# Patient Record
Sex: Male | Born: 2005 | Race: White | Hispanic: No | Marital: Single | Smoking: Never smoker
Health system: Southern US, Community
[De-identification: ages and names within clinical notes are randomized; demographics above are authoritative.]

## PROBLEM LIST (undated history)

## (undated) HISTORY — PX: CHOLECYSTECTOMY: SHX55

---
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
## 2024-01-18 ENCOUNTER — Ambulatory Visit (HOSPITAL_BASED_OUTPATIENT_CLINIC_OR_DEPARTMENT_OTHER): Payer: Self-pay

## 2024-03-13 ENCOUNTER — Ambulatory Visit (HOSPITAL_BASED_OUTPATIENT_CLINIC_OR_DEPARTMENT_OTHER): Admission: EM | Admit: 2024-03-13 | Discharge: 2024-03-13 | Disposition: A | Discharge status: Home / Self Care

## 2024-03-13 ENCOUNTER — Encounter (HOSPITAL_BASED_OUTPATIENT_CLINIC_OR_DEPARTMENT_OTHER): Payer: Self-pay

## 2024-03-13 DIAGNOSIS — J029 Acute pharyngitis, unspecified: Secondary | ICD-10-CM | POA: Diagnosis not present

## 2024-03-13 NOTE — ED Provider Notes (Signed)
 [facility]    CSN: [id number] Arrival date & time: 03/13/24  1308      History   Chief Complaint Chief Complaint  Patient presents with   Sore Throat    HPI Dylan Walker is a 18 y.o. male.   Pt c/o sore throat starting yesterday. He is also having runny nose/nasal congestion that is worst in the morning and gets better as the day goes on. He is having increased sneezing. Pt denies fever, cough, chills, or body aches. He has taken benadryl with some relief. He requested a work note for his symptoms.     Sore Throat    History reviewed. No pertinent past medical history.  There are no active problems to display for this patient.   Past Surgical History:  Procedure Laterality Date   CHOLECYSTECTOMY         Home Medications    Prior to Admission medications   Not on File    Family History History reviewed. No pertinent family history.  Social History Social History   Tobacco Use   Smoking status: Never   Smokeless tobacco: Never  Vaping Use   Vaping status: Never Used  Substance Use Topics   Alcohol use: No   Drug use: No     Allergies   Patient has no known allergies.   Review of Systems Review of Systems See HPI  Physical Exam Triage Vital Signs ED Triage Vitals  Encounter Vitals Group     BP 03/13/24 1323 128/83     Girls Systolic BP Percentile --      Girls Diastolic BP Percentile --      Boys Systolic BP Percentile --      Boys Diastolic BP Percentile --      Pulse Rate 03/13/24 1323 76     Resp 03/13/24 1323 20     Temp 03/13/24 1323 99 F (37.2 C)     Temp Source 03/13/24 1323 Oral     SpO2 03/13/24 1323 96 %     Weight --      Height --      Head Circumference --      Peak Flow --      Pain Score 03/13/24 1321 4     Pain Loc --      Pain Education --      Exclude from Growth Chart --    No data found.  Updated Vital Signs BP 128/83 (BP Location: Right Arm)   Pulse 76   Temp 99 F (37.2 C) (Oral)    Resp 20   SpO2 96%   Visual Acuity Right Eye Distance:   Left Eye Distance:   Bilateral Distance:    Right Eye Near:   Left Eye Near:    Bilateral Near:     Physical Exam Constitutional:      General: He is not in acute distress.    Appearance: Normal appearance. He is not ill-appearing, toxic-appearing or diaphoretic.  HENT:     Right Ear: Tympanic membrane, ear canal and external ear normal.     Left Ear: Tympanic membrane, ear canal and external ear normal.     Mouth/Throat:     Pharynx: Oropharynx is clear.  Eyes:     Conjunctiva/sclera: Conjunctivae normal.  Cardiovascular:     Rate and Rhythm: Normal rate and regular rhythm.     Pulses: Normal pulses.     Heart sounds: Normal heart sounds.  Pulmonary:     Effort:  Pulmonary effort is normal.     Breath sounds: Normal breath sounds.  Musculoskeletal:        General: Normal range of motion.  Skin:    General: Skin is warm and dry.  Neurological:     Mental Status: He is alert.  Psychiatric:        Mood and Affect: Mood normal.      UC Treatments / Results  Labs (all labs ordered are listed, but only abnormal results are displayed) Labs Reviewed - No data to display  EKG   Radiology No results found.  Procedures Procedures (including critical care time)  Medications Ordered in UC Medications - No data to display  Initial Impression / Assessment and Plan / UC Course  I have reviewed the triage vital signs and the nursing notes.  Pertinent labs & imaging results that were available during my care of the patient were reviewed by me and considered in my medical decision making (see chart for details).     Sore throat.  Viral illness verus allergies-no concerns on exam.  Recommend symptomatic treatment for relief of symptoms. Rest, hydrate and follow-up as needed  Final Clinical Impressions(s) / UC Diagnoses   Final diagnoses:  Sore throat     Discharge Instructions      No concerns on exam  today.  Recommend starting back on your allergy medication. Start Zyrtec  and Flonase  daily. Follow-up for any continued issues   ED Prescriptions   None    PDMP not reviewed this encounter.   Adah Wilbert LABOR, FNP 03/14/24 1550

## 2024-03-13 NOTE — ED Triage Notes (Signed)
 Pt c/o sore throat starting yesterday. He is also having runny nose/nasal congestion that is worst in the morning and gets better as the day goes on. He is having increased sneezing. Pt denies fever, cough, chills, or body aches. He has taken benadryl with some relief. He requested a work note for his symptoms.

## 2024-03-13 NOTE — Discharge Instructions (Signed)
 No concerns on exam today.  Recommend starting back on your allergy medication. Start Zyrtec  and Flonase  daily. Follow-up for any continued issues

## 2024-03-29 ENCOUNTER — Ambulatory Visit (HOSPITAL_BASED_OUTPATIENT_CLINIC_OR_DEPARTMENT_OTHER)
Admission: RE | Admit: 2024-03-29 | Discharge: 2024-03-29 | Disposition: A | Discharge status: Home / Self Care | Source: Ambulatory Visit | Attending: Family Medicine | Admitting: Family Medicine

## 2024-03-29 ENCOUNTER — Encounter (HOSPITAL_BASED_OUTPATIENT_CLINIC_OR_DEPARTMENT_OTHER): Payer: Self-pay

## 2024-03-29 VITALS — BP 138/78 | HR 74 | Temp 97.7°F | Resp 20

## 2024-03-29 DIAGNOSIS — J029 Acute pharyngitis, unspecified: Secondary | ICD-10-CM | POA: Insufficient documentation

## 2024-03-29 DIAGNOSIS — J039 Acute tonsillitis, unspecified: Secondary | ICD-10-CM | POA: Diagnosis present

## 2024-03-29 DIAGNOSIS — R52 Pain, unspecified: Secondary | ICD-10-CM | POA: Diagnosis present

## 2024-03-29 LAB — POCT RAPID STREP A (OFFICE): Rapid Strep A Screen: NEGATIVE

## 2024-03-29 LAB — POC SOFIA SARS ANTIGEN FIA: SARS Coronavirus 2 Ag: NEGATIVE

## 2024-03-29 MED ORDER — AMOXICILLIN 875 MG PO TABS: 875.0000 mg | ORAL_TABLET | Freq: Two times a day (BID) | ORAL | 0 refills | Status: AC

## 2024-03-29 NOTE — ED Provider Notes (Signed)
 [facility]    CSN: [id number] Arrival date & time: 03/29/24  1155      History   Chief Complaint Chief Complaint  Patient presents with   Sore Throat    Entered by patient    HPI Dylan Walker is a 18 y.o. male.   18 year old male with report of acute onset of sore throat with nasal drainage on the night of 03/28/2024.  His symptoms have persisted today and he has some malaise and mild bodyaches.  He denies fever, cough, nausea, vomiting, constipation, diarrhea.   Sore Throat Pertinent negatives include no chest pain and no abdominal pain.    History reviewed. No pertinent past medical history.  There are no active problems to display for this patient.   Past Surgical History:  Procedure Laterality Date   CHOLECYSTECTOMY         Home Medications    Prior to Admission medications   Medication Sig Start Date End Date Taking? Authorizing Provider  amoxicillin  (AMOXIL ) 875 MG tablet Take 1 tablet (875 mg total) by mouth 2 (two) times daily for 7 days. 03/29/24 04/05/24 Yes Ival Domino, FNP    Family History History reviewed. No pertinent family history.  Social History Social History   Tobacco Use   Smoking status: Never   Smokeless tobacco: Never  Vaping Use   Vaping status: Never Used  Substance Use Topics   Alcohol use: No   Drug use: No     Allergies   Patient has no known allergies.   Review of Systems Review of Systems  Constitutional:  Negative for chills and fever.  HENT:  Positive for congestion, postnasal drip, rhinorrhea and sore throat. Negative for ear pain.   Eyes:  Negative for pain and visual disturbance.  Respiratory:  Negative for cough.   Cardiovascular:  Negative for chest pain and palpitations.  Gastrointestinal:  Negative for abdominal pain, constipation, diarrhea, nausea and vomiting.  Genitourinary:  Negative for dysuria and hematuria.  Musculoskeletal:  Positive for arthralgias. Negative for back pain.   Skin:  Negative for color change and rash.  Neurological:  Negative for seizures and syncope.  All other systems reviewed and are negative.    Physical Exam Triage Vital Signs ED Triage Vitals [03/29/24 1222]  Encounter Vitals Group     BP 138/78     Girls Systolic BP Percentile      Girls Diastolic BP Percentile      Boys Systolic BP Percentile      Boys Diastolic BP Percentile      Pulse Rate 74     Resp 20     Temp 97.7 F (36.5 C)     Temp Source Oral     SpO2 97 %     Weight      Height      Head Circumference      Peak Flow      Pain Score 2     Pain Loc      Pain Education      Exclude from Growth Chart    No data found.  Updated Vital Signs BP 138/78 (BP Location: Right Arm)   Pulse 74   Temp 97.7 F (36.5 C) (Oral)   Resp 20   SpO2 97%   Visual Acuity Right Eye Distance:   Left Eye Distance:   Bilateral Distance:    Right Eye Near:   Left Eye Near:    Bilateral Near:     Physical Exam  Vitals and nursing note reviewed.  Constitutional:      General: He is not in acute distress.    Appearance: He is well-developed. He is not ill-appearing or toxic-appearing.  HENT:     Head: Normocephalic and atraumatic.     Right Ear: Hearing, tympanic membrane, ear canal and external ear normal.     Left Ear: Hearing, tympanic membrane, ear canal and external ear normal.     Nose: Congestion and rhinorrhea present. Rhinorrhea is clear.     Right Sinus: No maxillary sinus tenderness or frontal sinus tenderness.     Left Sinus: No maxillary sinus tenderness or frontal sinus tenderness.     Mouth/Throat:     Lips: Pink.     Mouth: Mucous membranes are moist.     Pharynx: Uvula midline. Posterior oropharyngeal erythema present. No oropharyngeal exudate.     Tonsils: No tonsillar exudate (Erythema and enlargement of tonsils bilaterally but no exudate).  Eyes:     Conjunctiva/sclera: Conjunctivae normal.     Pupils: Pupils are equal, round, and reactive to  light.  Cardiovascular:     Rate and Rhythm: Normal rate and regular rhythm.     Heart sounds: S1 normal and S2 normal. No murmur heard. Pulmonary:     Effort: Pulmonary effort is normal. No respiratory distress.     Breath sounds: Normal breath sounds. No decreased breath sounds, wheezing, rhonchi or rales.  Abdominal:     General: Bowel sounds are normal.     Palpations: Abdomen is soft.     Tenderness: There is no abdominal tenderness.  Musculoskeletal:        General: No swelling.     Cervical back: Neck supple.  Lymphadenopathy:     Head:     Right side of head: No submental, submandibular, tonsillar, preauricular or posterior auricular adenopathy.     Left side of head: No submental, submandibular, tonsillar, preauricular or posterior auricular adenopathy.     Cervical: Cervical adenopathy present.     Right cervical: Superficial cervical adenopathy present.     Left cervical: Superficial cervical adenopathy present.  Skin:    General: Skin is warm and dry.     Capillary Refill: Capillary refill takes less than 2 seconds.     Findings: No rash.  Neurological:     Mental Status: He is alert and oriented to person, place, and time.  Psychiatric:        Mood and Affect: Mood normal.      UC Treatments / Results  Labs (all labs ordered are listed, but only abnormal results are displayed) Labs Reviewed  POCT RAPID STREP A (OFFICE) - Normal  POC SOFIA SARS ANTIGEN FIA - Normal  CULTURE, GROUP A STREP [facility])    EKG   Radiology No results found.  Procedures Procedures (including critical care time)  Medications Ordered in UC Medications - No data to display  Initial Impression / Assessment and Plan / UC Course  I have reviewed the triage vital signs and the nursing notes.  Pertinent labs & imaging results that were available during my care of the patient were reviewed by me and considered in my medical decision making (see chart for details).  Plan of  Care: Tonsillitis with sore throat and bodyaches: Rapid strep is negative.  Throat culture sent.  Will adjust the plan of care, as needed once the culture results.  Amoxicillin , 875 mg, 1 pill twice daily for 7 days.  Will be instructed to stop  the amoxicillin  if the throat culture is negative.    Rapid COVID test was negative.  If symptoms persist, needs to retest for COVID at home tomorrow afternoon.  Work excuse provided.  Follow-up if symptoms do not improve, worsen or new symptoms occur.  I reviewed the plan of care with the patient and/or the patient's guardian.  The patient and/or guardian had time to ask questions and acknowledged that the questions were answered.  I provided instruction on symptoms or reasons to return here or to go to an ER, if symptoms/condition did not improve, worsened or if new symptoms occurred.  Final Clinical Impressions(s) / UC Diagnoses   Final diagnoses:  Sore throat  Generalized body aches  Acute tonsillitis, unspecified etiology     Discharge Instructions      Tonsillitis with sore throat and bodyaches: COVID test was negative.  Rapid strep is negative.  Throat culture sent.  Get plenty of fluids and rest.  Amoxicillin , 875 mg, 1 pill twice daily for tonsillitis.  Patient will be called if his throat culture is negative and asked to stop the antibiotic.  If his throat culture is positive the results will be available on the portal.  Encouraged that if his symptoms persist or worsen, he should retest for COVID at home tomorrow.  Work excuse provided.  Follow-up here as needed.     ED Prescriptions     Medication Sig Dispense Auth. Provider   amoxicillin  (AMOXIL ) 875 MG tablet Take 1 tablet (875 mg total) by mouth 2 (two) times daily for 7 days. 14 tablet Danaja Lasota, FNP      PDMP not reviewed this encounter.   Ival Domino, FNP 03/29/24 1314

## 2024-03-29 NOTE — Discharge Instructions (Addendum)
 Tonsillitis with sore throat and bodyaches: COVID test was negative.  Rapid strep is negative.  Throat culture sent.  Get plenty of fluids and rest.  Amoxicillin , 875 mg, 1 pill twice daily for tonsillitis.  Patient will be called if his throat culture is negative and asked to stop the antibiotic.  If his throat culture is positive the results will be available on the portal.  Encouraged that if his symptoms persist or worsen, he should retest for COVID at home tomorrow.  Work excuse provided.  Follow-up here as needed.

## 2024-03-29 NOTE — ED Triage Notes (Signed)
 Pt c/o sore throat, nasal drainage started last night.

## 2024-04-01 ENCOUNTER — Ambulatory Visit (HOSPITAL_COMMUNITY): Payer: Self-pay

## 2024-04-01 LAB — CULTURE, GROUP A STREP (THRC)

## 2024-04-02 NOTE — Progress Notes (Signed)
 Throat culture is negative.  No strep throat.  He was sent home on amoxicillin  for tonsillitis due to the appearance of his throat on exam.  Advised that if he is doing well and tolerating the amoxicillin  he can complete it.  If he is having any problems tolerating the amoxicillin  he could stop it and additional antibiotic is not needed.  Unable to speak to him directly but was able to leave a voicemail message

## 2024-07-04 ENCOUNTER — Ambulatory Visit (HOSPITAL_BASED_OUTPATIENT_CLINIC_OR_DEPARTMENT_OTHER)
Admission: RE | Admit: 2024-07-04 | Discharge: 2024-07-04 | Disposition: A | Discharge status: Home / Self Care | Payer: Self-pay | Source: Ambulatory Visit | Attending: Family Medicine | Admitting: Family Medicine

## 2024-07-04 ENCOUNTER — Encounter (HOSPITAL_BASED_OUTPATIENT_CLINIC_OR_DEPARTMENT_OTHER): Payer: Self-pay

## 2024-07-04 ENCOUNTER — Other Ambulatory Visit (HOSPITAL_BASED_OUTPATIENT_CLINIC_OR_DEPARTMENT_OTHER): Payer: Self-pay

## 2024-07-04 VITALS — BP 133/76 | HR 126 | Temp 102.6°F | Resp 22

## 2024-07-04 DIAGNOSIS — R509 Fever, unspecified: Secondary | ICD-10-CM | POA: Diagnosis not present

## 2024-07-04 DIAGNOSIS — R112 Nausea with vomiting, unspecified: Secondary | ICD-10-CM | POA: Diagnosis not present

## 2024-07-04 DIAGNOSIS — J101 Influenza due to other identified influenza virus with other respiratory manifestations: Secondary | ICD-10-CM | POA: Diagnosis not present

## 2024-07-04 LAB — POCT INFLUENZA A/B
Influenza A, POC: NEGATIVE
Influenza B, POC: POSITIVE — AB

## 2024-07-04 MED ORDER — OSELTAMIVIR PHOSPHATE 75 MG PO CAPS
75.0000 mg | ORAL_CAPSULE | Freq: Two times a day (BID) | ORAL | 0 refills | Status: DC
  Filled 2024-07-04: qty 10, 5d supply, fill #0

## 2024-07-04 MED ORDER — ONDANSETRON 4 MG PO TBDP
4.0000 mg | ORAL_TABLET | Freq: Three times a day (TID) | ORAL | 0 refills | Status: AC | PRN
  Filled 2024-07-04: qty 20, 7d supply, fill #0

## 2024-07-04 MED ORDER — IBUPROFEN 600 MG PO TABS
600.0000 mg | ORAL_TABLET | Freq: Once | ORAL | Status: AC
  Administered 2024-07-04: 600 mg via ORAL

## 2024-07-04 MED ORDER — ONDANSETRON 4 MG PO TBDP
4.0000 mg | ORAL_TABLET | Freq: Once | ORAL | Status: AC
  Administered 2024-07-04: 4 mg via ORAL

## 2024-07-04 MED ORDER — PROMETHAZINE-DM 6.25-15 MG/5ML PO SYRP
5.0000 mL | ORAL_SOLUTION | Freq: Four times a day (QID) | ORAL | 0 refills | Status: DC | PRN
  Filled 2024-07-04: qty 118, 6d supply, fill #0

## 2024-07-04 NOTE — ED Triage Notes (Signed)
 Fever/headache onset Wednesday. States right side of face painful last night. Vomited this morning. Patient has been taking ibuprofen , nyquil. Last dose of nyquil was last night. Temp 103 on arrival.

## 2024-07-04 NOTE — ED Provider Notes (Addendum)
 " [facility]    CSN: [id number] Arrival date & time: 07/04/24  1417      History   Chief Complaint Chief Complaint  Patient presents with   Fever    Entered by patient    HPI Dylan Walker is a 18 y.o. male.   18 year old male with complaint of fever and headache that started on the night of 07/02/2024.  He had facial pain on the right side of his face last night.  He has been vomiting this morning.  He has taken ibuprofen  and NyQuil.  His temp is 103.0 on arrival to urgent care.   Fever Associated symptoms: congestion, cough and rhinorrhea   Associated symptoms: no chest pain, no chills, no diarrhea, no dysuria, no ear pain, no nausea, no rash, no sore throat and no vomiting     History reviewed. No pertinent past medical history.  There are no active problems to display for this patient.   Past Surgical History:  Procedure Laterality Date   CHOLECYSTECTOMY         Home Medications    Prior to Admission medications  Medication Sig Start Date End Date Taking? Authorizing Provider  ondansetron  (ZOFRAN -ODT) 4 MG disintegrating tablet Take 1 tablet (4 mg total) by mouth every 8 (eight) hours as needed for nausea or vomiting. 07/04/24  Yes Ival Domino, FNP  oseltamivir  (TAMIFLU ) 75 MG capsule Take 1 capsule (75 mg total) by mouth every 12 (twelve) hours. 07/04/24  Yes Ival Domino, FNP  promethazine -dextromethorphan (PROMETHAZINE -DM) 6.25-15 MG/5ML syrup Take 5 mLs by mouth 4 (four) times daily as needed for cough. Do not use and drive - May make drowsy. 07/04/24  Yes Ival Domino, FNP    Family History History reviewed. No pertinent family history.  Social History Social History[1]   Allergies   Patient has no known allergies.   Review of Systems Review of Systems  Constitutional:  Positive for fever. Negative for chills.  HENT:  Positive for congestion, postnasal drip and rhinorrhea. Negative for ear pain and sore throat.   Eyes:  Negative  for pain and visual disturbance.  Respiratory:  Positive for cough.   Cardiovascular:  Negative for chest pain and palpitations.  Gastrointestinal:  Negative for abdominal pain, constipation, diarrhea, nausea and vomiting.  Genitourinary:  Negative for dysuria and hematuria.  Musculoskeletal:  Positive for arthralgias. Negative for back pain.  Skin:  Negative for color change and rash.  Neurological:  Negative for seizures and syncope.  All other systems reviewed and are negative.    Physical Exam Triage Vital Signs ED Triage Vitals  Encounter Vitals Group     BP 07/04/24 1442 133/76     Girls Systolic BP Percentile --      Girls Diastolic BP Percentile --      Boys Systolic BP Percentile --      Boys Diastolic BP Percentile --      Pulse Rate 07/04/24 1442 (!) 126     Resp 07/04/24 1442 (!) 22     Temp 07/04/24 1442 (!) 103 F (39.4 C)     Temp Source 07/04/24 1442 Oral     SpO2 07/04/24 1442 93 %     Weight --      Height --      Head Circumference --      Peak Flow --      Pain Score 07/04/24 1443 8     Pain Loc --      Pain Education --  Exclude from Growth Chart --    No data found.  Updated Vital Signs BP 133/76 (BP Location: Right Arm)   Pulse (!) 126   Temp (!) 102.6 F (39.2 C) (Oral)   Resp (!) 22   SpO2 93%   Visual Acuity Right Eye Distance:   Left Eye Distance:   Bilateral Distance:    Right Eye Near:   Left Eye Near:    Bilateral Near:     Physical Exam Vitals and nursing note reviewed.  Constitutional:      General: He is not in acute distress.    Appearance: He is well-developed. He is ill-appearing. He is not toxic-appearing or diaphoretic.  HENT:     Head: Normocephalic and atraumatic.     Right Ear: Hearing, tympanic membrane, ear canal and external ear normal.     Left Ear: Hearing, tympanic membrane, ear canal and external ear normal.     Nose: Congestion and rhinorrhea present. Rhinorrhea is clear.     Right Sinus: No  maxillary sinus tenderness or frontal sinus tenderness.     Left Sinus: No maxillary sinus tenderness or frontal sinus tenderness.     Mouth/Throat:     Lips: Pink.     Mouth: Mucous membranes are dry.     Pharynx: Uvula midline. Posterior oropharyngeal erythema present. No oropharyngeal exudate.     Tonsils: No tonsillar exudate.     Comments: Lips are dry and chapped. Eyes:     Conjunctiva/sclera: Conjunctivae normal.     Pupils: Pupils are equal, round, and reactive to light.  Cardiovascular:     Rate and Rhythm: Normal rate and regular rhythm.     Heart sounds: S1 normal and S2 normal. No murmur heard. Pulmonary:     Effort: Pulmonary effort is normal. No respiratory distress.     Breath sounds: Normal breath sounds. No decreased breath sounds, wheezing, rhonchi or rales.  Abdominal:     General: Bowel sounds are normal.     Palpations: Abdomen is soft.     Tenderness: There is generalized abdominal tenderness (Mild). There is no right CVA tenderness, left CVA tenderness, guarding or rebound. Negative signs include Murphy's sign, Rovsing's sign and McBurney's sign.  Musculoskeletal:        General: No swelling.     Cervical back: Neck supple.  Lymphadenopathy:     Head:     Right side of head: No submental, submandibular, tonsillar, preauricular or posterior auricular adenopathy.     Left side of head: No submental, submandibular, tonsillar, preauricular or posterior auricular adenopathy.     Cervical: Cervical adenopathy present.     Right cervical: Superficial cervical adenopathy present.     Left cervical: Superficial cervical adenopathy present.  Skin:    General: Skin is warm and dry.     Capillary Refill: Capillary refill takes less than 2 seconds.     Findings: No rash.  Neurological:     Mental Status: He is alert and oriented to person, place, and time.  Psychiatric:        Mood and Affect: Mood normal.      UC Treatments / Results  Labs (all labs ordered are  listed, but only abnormal results are displayed) Labs Reviewed  POCT INFLUENZA A/B - Abnormal; Notable for the following components:      Result Value   Influenza B, POC Positive (*)    All other components within normal limits    EKG   Radiology No  results found.  Procedures Procedures (including critical care time)  Medications Ordered in UC Medications  ibuprofen  (ADVIL ) tablet 600 mg (600 mg Oral Given 07/04/24 1510)  ondansetron  (ZOFRAN -ODT) disintegrating tablet 4 mg (4 mg Oral Given 07/04/24 1513)    Initial Impression / Assessment and Plan / UC Course  I have reviewed the triage vital signs and the nursing notes.  Pertinent labs & imaging results that were available during my care of the patient were reviewed by me and considered in my medical decision making (see chart for details).  Plan of Care (see discharge instructions for additional patient precautions and education): Influenza type B with nausea, vomiting and fever: Given ondansetron , 4 mg melts on the tongue during the visit.  Given ibuprofen , 600 mg for fever.  Take ondansetron , 4 mg, every 8 hours if needed for any nausea or vomiting.  Tamiflu  75 mg twice daily with food for the flu.  Promethazine  DM, 5 mL, every 6 hours if needed for cough.  Use acetaminophen or ibuprofen , as directed on the package, every 4 hours if needed for fever or bodyaches.  Get plenty of fluids and rest.  Follow-up if symptoms do not improve, worsen or new symptoms occur.  See below for signs and symptoms of worsening condition and reasons to go to the emergency room.  I reviewed the plan of care with the patient and/or the patient's guardian.  The patient and/or guardian had time to ask questions and acknowledged that the questions were answered.  Final Clinical Impressions(s) / UC Diagnoses   Final diagnoses:  Fever, unspecified  Nausea and vomiting, unspecified vomiting type  Influenza due to influenza virus, type B      Discharge Instructions      Influenza type B with nausea, vomiting and fever: Given ondansetron , 4 mg melts on the tongue during the visit.  Given ibuprofen , 600 mg for fever.  Take ondansetron , 4 mg, every 8 hours if needed for any nausea or vomiting.  Tamiflu  75 mg twice daily with food for the flu.  Promethazine  DM, 5 mL, every 6 hours if needed for cough.  Use acetaminophen or ibuprofen , as directed on the package, every 4 hours if needed for fever or bodyaches.  Get plenty of fluids and rest.  Follow-up if symptoms do not improve, worsen or new symptoms occur.  See below for signs and symptoms of worsening condition and reasons to go to the emergency room.  Get help right away if: You become short of breath or have trouble breathing. Your skin or nails turn blue. You have very bad pain or stiffness in your neck. You get a sudden headache or pain in your face or ear. You vomit each time you eat or drink. These symptoms may be an emergency. Call 911 right away. Do not wait to see if the symptoms will go away. Do not drive yourself to the hospital.     ED Prescriptions     Medication Sig Dispense Auth. Provider   ondansetron  (ZOFRAN -ODT) 4 MG disintegrating tablet Take 1 tablet (4 mg total) by mouth every 8 (eight) hours as needed for nausea or vomiting. 20 tablet Floyd Lusignan, FNP   oseltamivir  (TAMIFLU ) 75 MG capsule Take 1 capsule (75 mg total) by mouth every 12 (twelve) hours. 10 capsule Ival Domino, FNP   promethazine -dextromethorphan (PROMETHAZINE -DM) 6.25-15 MG/5ML syrup Take 5 mLs by mouth 4 (four) times daily as needed for cough. Do not use and drive - May make drowsy. 118 mL Glendi Mohiuddin,  Lauraine, FNP      PDMP not reviewed this encounter.    Ival Lauraine, FNP 07/04/24 1514     [1]  Social History Tobacco Use   Smoking status: Never   Smokeless tobacco: Never  Vaping Use   Vaping status: Never Used  Substance Use Topics   Alcohol use: No   Drug use: No      Ival Lauraine, FNP 07/04/24 1539  "

## 2024-07-04 NOTE — Discharge Instructions (Addendum)
 Influenza type B with nausea, vomiting and fever: Given ondansetron , 4 mg melts on the tongue during the visit.  Given ibuprofen , 600 mg for fever.  Take ondansetron , 4 mg, every 8 hours if needed for any nausea or vomiting.  Tamiflu  75 mg twice daily with food for the flu.  Promethazine  DM, 5 mL, every 6 hours if needed for cough.  Use acetaminophen or ibuprofen , as directed on the package, every 4 hours if needed for fever or bodyaches.  Get plenty of fluids and rest.  Follow-up if symptoms do not improve, worsen or new symptoms occur.  See below for signs and symptoms of worsening condition and reasons to go to the emergency room.  Get help right away if: You become short of breath or have trouble breathing. Your skin or nails turn blue. You have very bad pain or stiffness in your neck. You get a sudden headache or pain in your face or ear. You vomit each time you eat or drink. These symptoms may be an emergency. Call 911 right away. Do not wait to see if the symptoms will go away. Do not drive yourself to the hospital.

## 2024-07-10 ENCOUNTER — Encounter (HOSPITAL_BASED_OUTPATIENT_CLINIC_OR_DEPARTMENT_OTHER): Payer: Self-pay

## 2024-07-10 ENCOUNTER — Ambulatory Visit (HOSPITAL_BASED_OUTPATIENT_CLINIC_OR_DEPARTMENT_OTHER)
Admission: RE | Admit: 2024-07-10 | Discharge: 2024-07-10 | Disposition: A | Discharge status: Home / Self Care | Source: Ambulatory Visit

## 2024-07-10 VITALS — BP 113/67 | HR 89 | Temp 98.1°F | Resp 18

## 2024-07-10 DIAGNOSIS — H66013 Acute suppurative otitis media with spontaneous rupture of ear drum, bilateral: Secondary | ICD-10-CM | POA: Diagnosis not present

## 2024-07-10 DIAGNOSIS — H60393 Other infective otitis externa, bilateral: Secondary | ICD-10-CM

## 2024-07-10 MED ORDER — CEFDINIR 300 MG PO CAPS: 300.0000 mg | ORAL_CAPSULE | Freq: Two times a day (BID) | ORAL | 0 refills | Status: AC

## 2024-07-10 MED ORDER — CIPROFLOXACIN-DEXAMETHASONE 0.3-0.1 % OT SUSP: 4.0000 [drp] | Freq: Two times a day (BID) | OTIC | 0 refills | Status: AC

## 2024-07-10 NOTE — Discharge Instructions (Addendum)
 Ear infections in both ears (internal ear infection and external ear infection): Cefdinir , 300 mg twice daily for 10 days.  Ciprodex eardrops, 4 drops into each ear twice daily for 7 days.  Get plenty of fluids and rest.  Make an appointment with primary care to be seen in 3 to 4 weeks.  Patient might have perforated eardrums allowing for the drainage.  He may need referral to ENT and that should come from primary care.  Follow-up here if needed.

## 2024-07-10 NOTE — ED Triage Notes (Addendum)
 Pt c/o bilateral ear fullness started with right ear a few days ago. Pt reports yellow drainage coming out of both ears.

## 2024-07-10 NOTE — ED Provider Notes (Signed)
 " [facility]    CSN: [id number] Arrival date & time: 07/10/24  1100      History   Chief Complaint Chief Complaint  Patient presents with   Ear Fullness    Entered by patient    HPI Dylan Walker is a 19 y.o. male.   Patient was seen on 07/04/2024 and diagnosed with influenza type A.  He was treated with Tamiflu  75 mg twice daily for 5 days and Promethazine  DM for cough.  He has completed those medicines.  Overall his symptoms got better but then he developed ear pain on approximately 07/08/2024 and noticed drainage coming out of both ears on 07/09/2024.  The drainage is yellow and green.  He denies fever, nausea, vomiting, constipation, diarrhea and only has a rare cough.   Ear Fullness Pertinent negatives include no chest pain and no abdominal pain.    History reviewed. No pertinent past medical history.  There are no active problems to display for this patient.   Past Surgical History:  Procedure Laterality Date   CHOLECYSTECTOMY         Home Medications    Prior to Admission medications  Medication Sig Start Date End Date Taking? Authorizing Provider  cefdinir  (OMNICEF ) 300 MG capsule Take 1 capsule (300 mg total) by mouth 2 (two) times daily for 10 days. 07/10/24 07/20/24 Yes Ival Domino, FNP  ciprofloxacin-dexamethasone (CIPRODEX) OTIC suspension Place 4 drops into both ears 2 (two) times daily. 07/10/24  Yes Ival Domino, FNP  ondansetron  (ZOFRAN -ODT) 4 MG disintegrating tablet Take 1 tablet (4 mg total) by mouth every 8 (eight) hours as needed for nausea or vomiting. 07/04/24   Ival Domino, FNP    Family History History reviewed. No pertinent family history.  Social History Social History[1]   Allergies   Patient has no known allergies.   Review of Systems Review of Systems  Constitutional:  Negative for chills and fever.  HENT:  Positive for congestion, ear discharge, ear pain, postnasal drip and rhinorrhea. Negative for sore throat.    Eyes:  Negative for pain and visual disturbance.  Respiratory:  Negative for cough.   Cardiovascular:  Negative for chest pain and palpitations.  Gastrointestinal:  Negative for abdominal pain, constipation, diarrhea, nausea and vomiting.  Genitourinary:  Negative for dysuria and hematuria.  Musculoskeletal:  Negative for arthralgias and back pain.  Skin:  Negative for color change and rash.  Neurological:  Negative for seizures and syncope.  All other systems reviewed and are negative.    Physical Exam Triage Vital Signs ED Triage Vitals  Encounter Vitals Group     BP 07/10/24 1213 113/67     Girls Systolic BP Percentile --      Girls Diastolic BP Percentile --      Boys Systolic BP Percentile --      Boys Diastolic BP Percentile --      Pulse Rate 07/10/24 1213 89     Resp 07/10/24 1213 18     Temp 07/10/24 1213 98.1 F (36.7 C)     Temp Source 07/10/24 1213 Oral     SpO2 07/10/24 1213 96 %     Weight --      Height --      Head Circumference --      Peak Flow --      Pain Score 07/10/24 1211 4     Pain Loc --      Pain Education --      Exclude from  Growth Chart --    No data found.  Updated Vital Signs BP 113/67 (BP Location: Right Arm)   Pulse 89   Temp 98.1 F (36.7 C) (Oral)   Resp 18   SpO2 96%   Visual Acuity Right Eye Distance:   Left Eye Distance:   Bilateral Distance:    Right Eye Near:   Left Eye Near:    Bilateral Near:     Physical Exam Vitals and nursing note reviewed.  Constitutional:      General: He is not in acute distress.    Appearance: He is well-developed. He is ill-appearing. He is not toxic-appearing or diaphoretic.  HENT:     Head: Normocephalic and atraumatic.     Right Ear: Hearing and external ear normal. Drainage (Purulent/green) present. No swelling or tenderness. A middle ear effusion is present. Tympanic membrane is erythematous and bulging.     Left Ear: Hearing and external ear normal. Drainage (Purulent/green)  present. No swelling or tenderness. A middle ear effusion is present. Tympanic membrane is erythematous and bulging.     Ears:     Comments: Bilateral TMs are visible on the upper half of the TM with bubbles of fluid.  The TMs are erythematous and slightly bulging.  There is moderate greenish-yellow purulent discharge in the canals that is about halfway up both TM.  Canals are normal in color without erythema.  The external ear was not tender during the exam.  I cannot validate if the TMs have a perforation or do not have a perforation due to the level of discharge in the ear canal.  It seems likely that there is bilateral perforations given how normal the ear canals look and how abnormal the tympanic membranes appear.    Nose: No congestion or rhinorrhea.     Right Sinus: No maxillary sinus tenderness or frontal sinus tenderness.     Left Sinus: No maxillary sinus tenderness or frontal sinus tenderness.     Mouth/Throat:     Lips: Pink.     Mouth: Mucous membranes are moist.     Pharynx: Uvula midline. No oropharyngeal exudate or posterior oropharyngeal erythema.     Tonsils: No tonsillar exudate.  Eyes:     Conjunctiva/sclera: Conjunctivae normal.     Pupils: Pupils are equal, round, and reactive to light.  Cardiovascular:     Rate and Rhythm: Normal rate and regular rhythm.     Heart sounds: S1 normal and S2 normal. No murmur heard. Pulmonary:     Effort: Pulmonary effort is normal. No respiratory distress.     Breath sounds: Normal breath sounds. No decreased breath sounds, wheezing, rhonchi or rales.  Abdominal:     General: Bowel sounds are normal.     Palpations: Abdomen is soft.     Tenderness: There is no abdominal tenderness.  Musculoskeletal:        General: No swelling.     Cervical back: Neck supple.  Lymphadenopathy:     Head:     Right side of head: Tonsillar, preauricular and posterior auricular adenopathy present. No submental or submandibular adenopathy.     Left side  of head: Tonsillar, preauricular and posterior auricular adenopathy present. No submental or submandibular adenopathy.     Cervical: Cervical adenopathy present.     Right cervical: Superficial cervical adenopathy present.     Left cervical: Superficial cervical adenopathy present.  Skin:    General: Skin is warm and dry.     Capillary Refill:  Capillary refill takes less than 2 seconds.     Findings: No rash.  Neurological:     Mental Status: He is alert and oriented to person, place, and time.  Psychiatric:        Mood and Affect: Mood normal.      UC Treatments / Results  Labs (all labs ordered are listed, but only abnormal results are displayed) Labs Reviewed - No data to display  EKG   Radiology No results found.  Procedures Procedures (including critical care time)  Medications Ordered in UC Medications - No data to display  Initial Impression / Assessment and Plan / UC Course  I have reviewed the triage vital signs and the nursing notes.  Pertinent labs & imaging results that were available during my care of the patient were reviewed by me and considered in my medical decision making (see chart for details).  Plan of Care (see discharge instructions for additional patient precautions and education): Ear infections in both ears (internal ear infection and external ear infection): Cefdinir , 300 mg twice daily for 10 days.  Ciprodex eardrops, 4 drops into each ear twice daily for 7 days.  Get plenty of fluids and rest.  Make an appointment with primary care to be seen in 3 to 4 weeks.  Patient might have perforated eardrums allowing for the drainage.  He may need referral to ENT and that should come from primary care.  Follow-up here if needed.  I reviewed the plan of care with the patient and/or the patient's guardian.  The patient and/or guardian had time to ask questions and acknowledged that the questions were answered.  Final Clinical Impressions(s) / UC Diagnoses    Final diagnoses:  Infective otitis externa of both ears  Non-recurrent acute suppurative otitis media of both ears with spontaneous rupture of tympanic membranes     Discharge Instructions      Ear infections in both ears (internal ear infection and external ear infection): Cefdinir , 300 mg twice daily for 10 days.  Ciprodex eardrops, 4 drops into each ear twice daily for 7 days.  Get plenty of fluids and rest.  Make an appointment with primary care to be seen in 3 to 4 weeks.  Patient might have perforated eardrums allowing for the drainage.  He may need referral to ENT and that should come from primary care.  Follow-up here if needed.     ED Prescriptions     Medication Sig Dispense Auth. Provider   cefdinir  (OMNICEF ) 300 MG capsule Take 1 capsule (300 mg total) by mouth 2 (two) times daily for 10 days. 20 capsule Ival Domino, FNP   ciprofloxacin-dexamethasone (CIPRODEX) OTIC suspension Place 4 drops into both ears 2 (two) times daily. 7.5 mL Ival Domino, FNP      PDMP not reviewed this encounter.    [1]  Social History Tobacco Use   Smoking status: Never   Smokeless tobacco: Never  Vaping Use   Vaping status: Never Used  Substance Use Topics   Alcohol use: No   Drug use: No     Ival Domino, FNP 07/10/24 1246  "
